# Patient Record
Sex: Female | Born: 1990 | Hispanic: Yes | Marital: Single
Health system: Southern US, Community
[De-identification: ages and names within clinical notes are randomized; demographics above are authoritative.]

---

## 2011-11-28 ENCOUNTER — Emergency Department (HOSPITAL_COMMUNITY): Payer: Medicaid Other

## 2011-11-28 ENCOUNTER — Emergency Department (HOSPITAL_COMMUNITY)
Admission: EM | Admit: 2011-11-28 | Discharge: 2011-11-28 | Disposition: A | Discharge status: Home / Self Care | Payer: Medicaid Other | Attending: Emergency Medicine | Admitting: Emergency Medicine

## 2011-11-28 ENCOUNTER — Encounter (HOSPITAL_COMMUNITY): Payer: Self-pay | Admitting: *Deleted

## 2011-11-28 DIAGNOSIS — S60229A Contusion of unspecified hand, initial encounter: Secondary | ICD-10-CM | POA: Insufficient documentation

## 2011-11-28 DIAGNOSIS — Y92410 Unspecified street and highway as the place of occurrence of the external cause: Secondary | ICD-10-CM | POA: Insufficient documentation

## 2011-11-28 DIAGNOSIS — M542 Cervicalgia: Secondary | ICD-10-CM | POA: Insufficient documentation

## 2011-11-28 DIAGNOSIS — S161XXA Strain of muscle, fascia and tendon at neck level, initial encounter: Secondary | ICD-10-CM

## 2011-11-28 DIAGNOSIS — S139XXA Sprain of joints and ligaments of unspecified parts of neck, initial encounter: Secondary | ICD-10-CM | POA: Insufficient documentation

## 2011-11-28 LAB — CBC
MCV: 87.4 fL (ref 78.0–100.0)
Platelets: 249 10*3/uL (ref 150–400)
RBC: 4.83 MIL/uL (ref 3.87–5.11)
WBC: 12.8 10*3/uL — ABNORMAL HIGH (ref 4.0–10.5)

## 2011-11-28 LAB — COMPREHENSIVE METABOLIC PANEL
ALT: 16 U/L (ref 0–35)
Alkaline Phosphatase: 103 U/L (ref 39–117)
CO2: 24 mEq/L (ref 19–32)
Calcium: 9.2 mg/dL (ref 8.4–10.5)
Chloride: 101 mEq/L (ref 96–112)
GFR calc Af Amer: >90 mL/min (ref >90)
GFR calc non Af Amer: >90 mL/min (ref >90)
Glucose, Bld: 97 mg/dL (ref 70–99)
Potassium: 3.4 mEq/L — ABNORMAL LOW (ref 3.5–5.1)
Sodium: 137 mEq/L (ref 135–145)
Total Bilirubin: 0.2 mg/dL — ABNORMAL LOW (ref 0.3–1.2)

## 2011-11-28 LAB — DIFFERENTIAL
Eosinophils Relative: 1 % (ref 0–5)
Lymphocytes Relative: 20 % (ref 12–46)
Lymphs Abs: 2.5 10*3/uL (ref 0.7–4.0)

## 2011-11-28 MED ORDER — IBUPROFEN 600 MG PO TABS: 600.0000 mg | ORAL_TABLET | Freq: Four times a day (QID) | ORAL | Status: AC | PRN

## 2011-11-28 MED ORDER — METHOCARBAMOL 500 MG PO TABS: 500.0000 mg | ORAL_TABLET | Freq: Two times a day (BID) | ORAL | Status: AC

## 2011-11-28 MED ORDER — SODIUM CHLORIDE 0.9 % IV BOLUS (SEPSIS)
1000.0000 mL | Freq: Once | INTRAVENOUS | Status: AC
  Administered 2011-11-28: 1000 mL via INTRAVENOUS

## 2011-11-28 MED ORDER — TRAMADOL HCL 50 MG PO TABS: 50.0000 mg | ORAL_TABLET | Freq: Four times a day (QID) | ORAL | Status: AC | PRN
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
## 2011-11-28 NOTE — ED Notes (Signed)
Patient transported to CT 
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
## 2011-11-28 NOTE — ED Notes (Signed)
Pt AO X 4.  Minimal swelling to L hand.  No other abnormalities.  Pt able to move all limbs and digits.  PERRL.

## 2011-11-28 NOTE — ED Notes (Signed)
Patient ambulated well to the bathroom and back.
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
## 2011-11-29 MED ORDER — OXYCODONE-ACETAMINOPHEN 5-325 MG PO TABS
ORAL_TABLET | ORAL | Status: AC
  Filled 2011-11-29: qty 1

## 2011-11-29 MED ORDER — HYDROMORPHONE HCL PF 1 MG/ML IJ SOLN
INTRAMUSCULAR | Status: AC
  Filled 2011-11-29: qty 1
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
## 2020-12-07 ENCOUNTER — Emergency Department
Admission: EM | Admit: 2020-12-07 | Discharge: 2020-12-07 | Disposition: A | Discharge status: Home / Self Care | Payer: Medicaid Other | Attending: Student in an Organized Health Care Education/Training Program | Admitting: Student in an Organized Health Care Education/Training Program

## 2020-12-07 ENCOUNTER — Encounter: Payer: Self-pay | Admitting: Emergency Medicine

## 2020-12-07 ENCOUNTER — Other Ambulatory Visit: Payer: Self-pay

## 2020-12-07 ENCOUNTER — Emergency Department: Payer: Medicaid Other

## 2020-12-07 DIAGNOSIS — M25511 Pain in right shoulder: Secondary | ICD-10-CM | POA: Insufficient documentation

## 2020-12-07 DIAGNOSIS — S60221A Contusion of right hand, initial encounter: Secondary | ICD-10-CM | POA: Insufficient documentation

## 2020-12-07 DIAGNOSIS — S5001XA Contusion of right elbow, initial encounter: Secondary | ICD-10-CM | POA: Insufficient documentation

## 2020-12-07 DIAGNOSIS — T07XXXA Unspecified multiple injuries, initial encounter: Secondary | ICD-10-CM
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
## 2020-12-07 NOTE — Discharge Instructions (Addendum)
Follow-up with your primary care provider or urgent care if any continued problems.  You can expect to be sore in the bruised areas for 5 to 7 days.  You may take Tylenol or ibuprofen as needed for pain.  You may also use ice on these areas for comfort.
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
## 2022-05-10 IMAGING — CR DG ELBOW COMPLETE 3+V*R*
1 series · 4 of 4 positions shown · non-contrast
Comparison: None.

CLINICAL DATA: Injury.  Pain

EXAM:
RIGHT ELBOW - COMPLETE 3+ VIEW

[Series 1: dg elbow complete right (3+view) · 0.14mm/px · 4 of 4 slices shown]
[im 1/4]
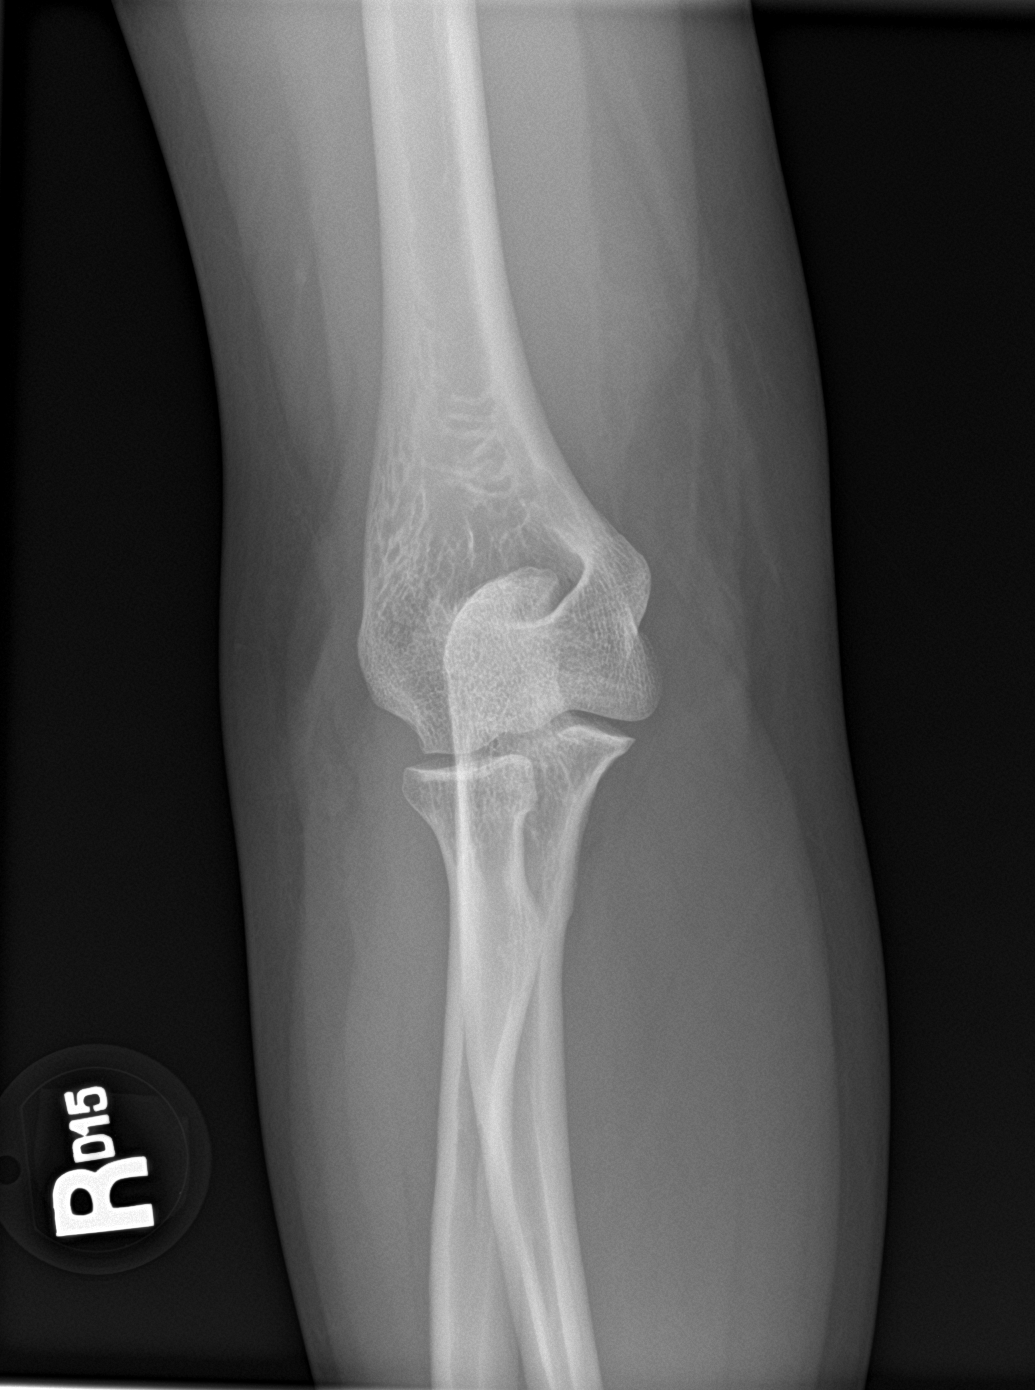
[im 2/4]
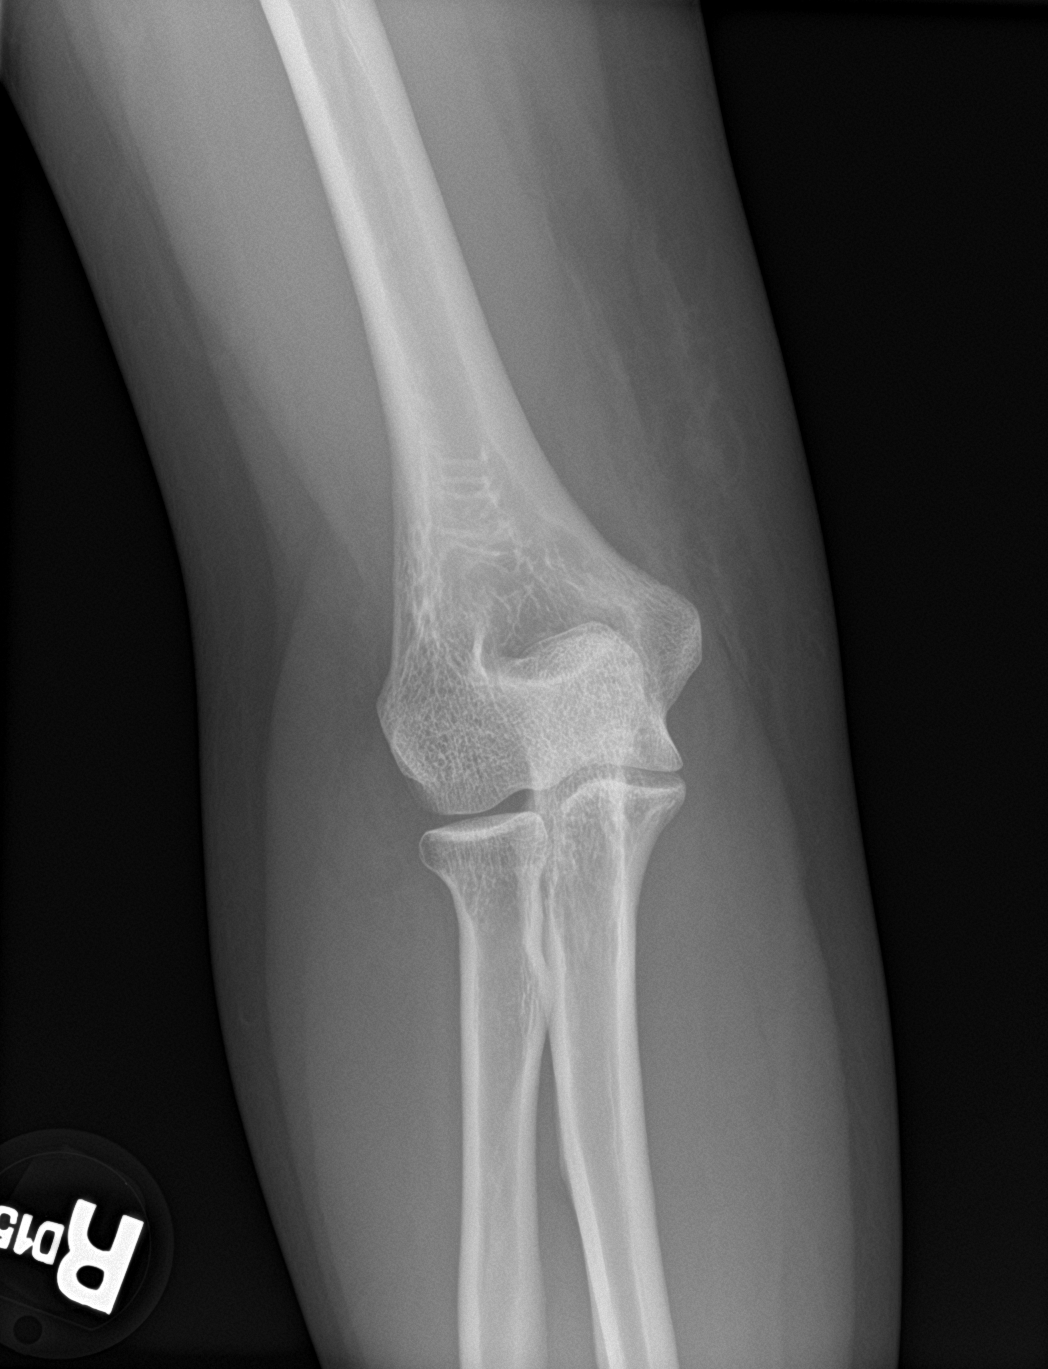
[im 3/4]
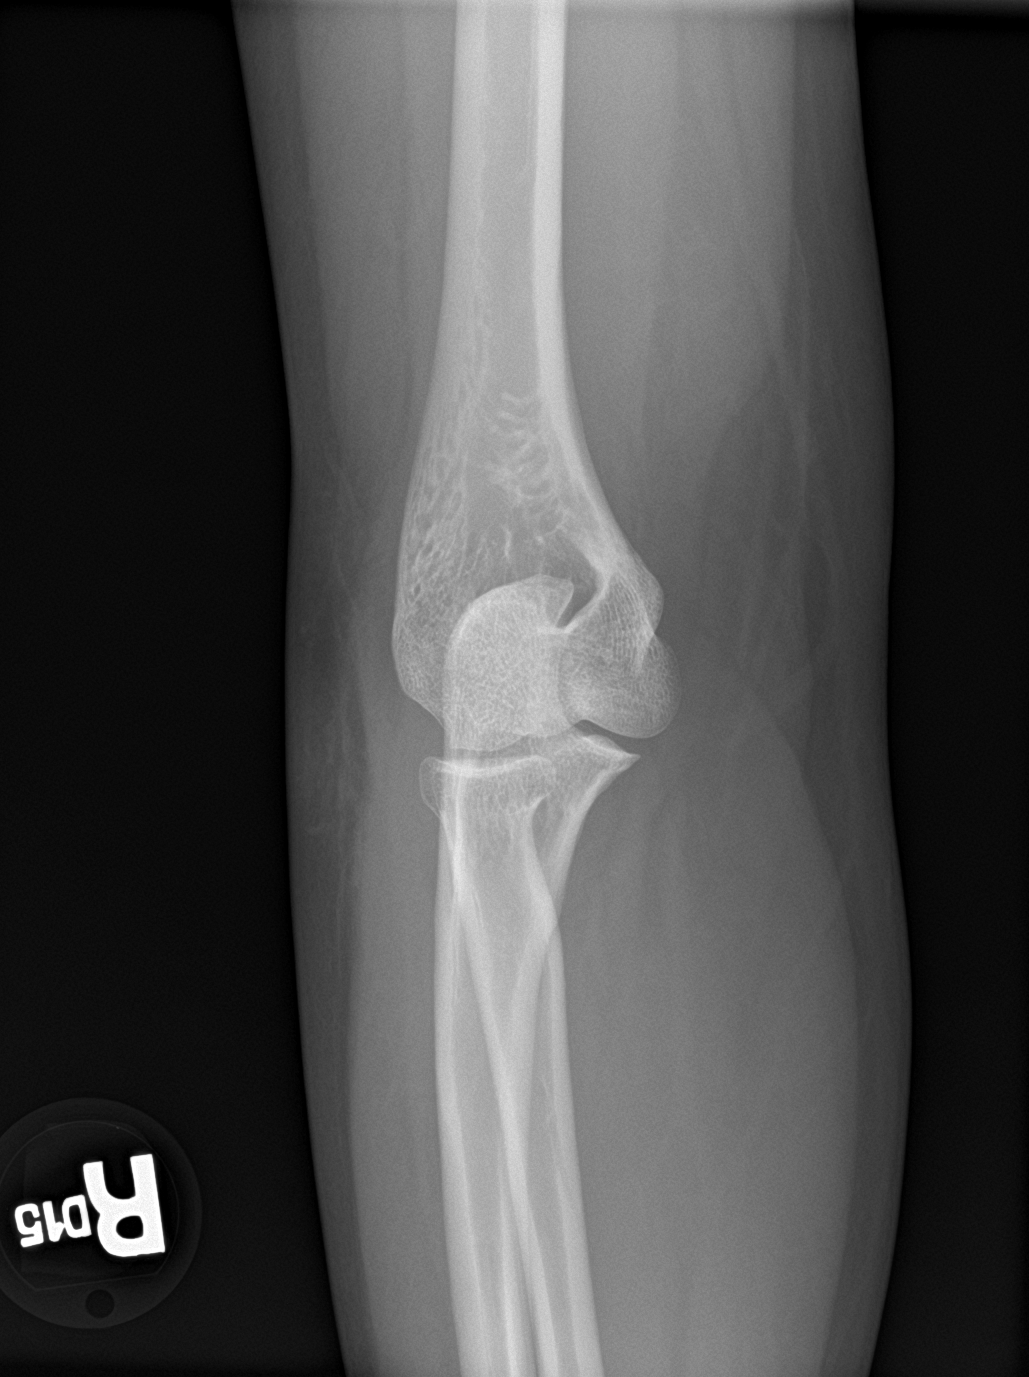
[im 4/4]
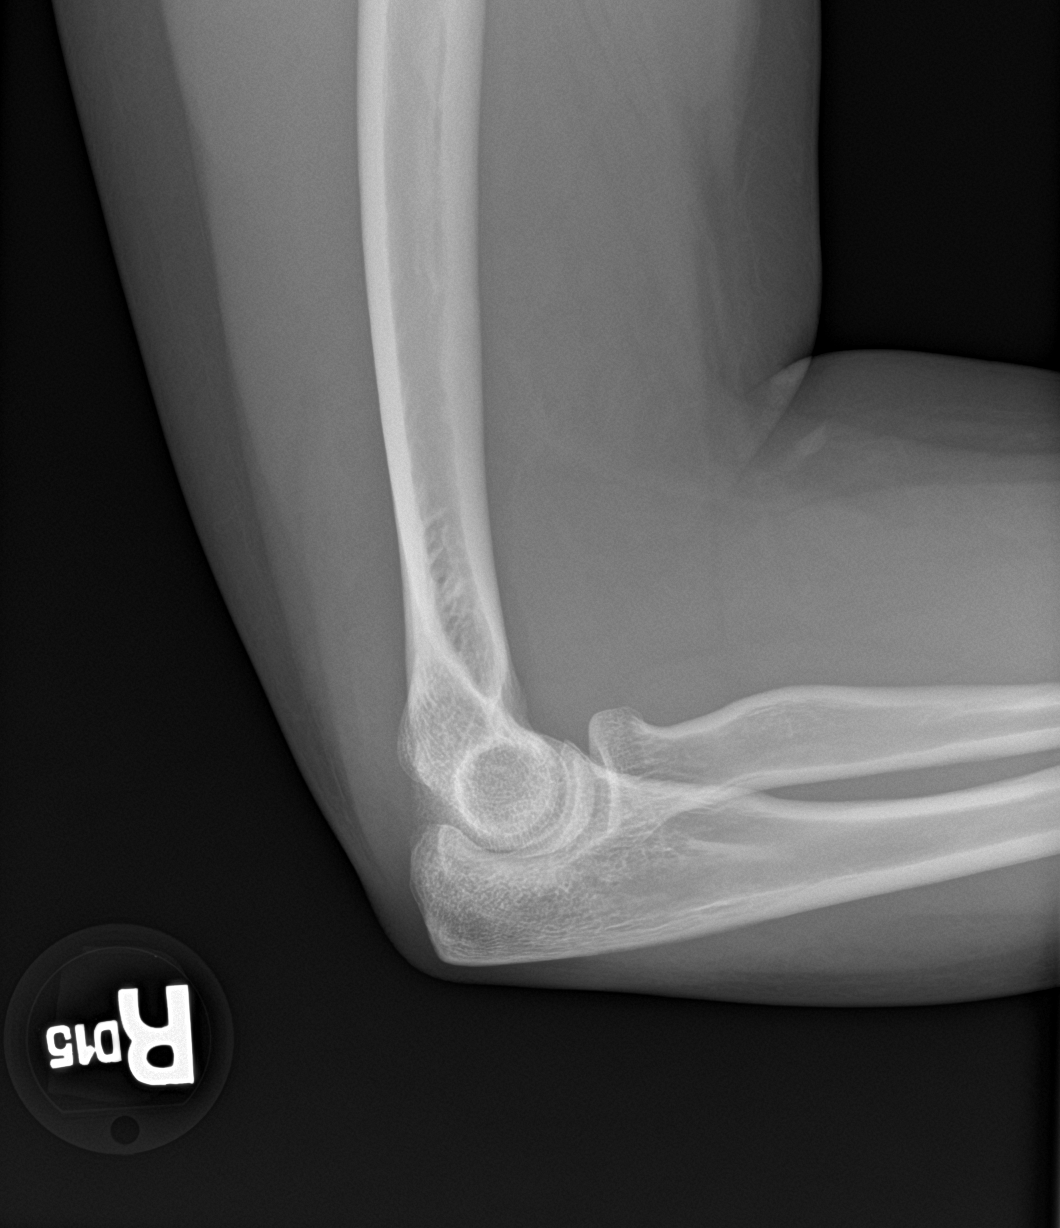

[4 of 4 positions shown; findings below may reference images not displayed]

FINDINGS: There is no evidence of fracture, dislocation, or joint effusion.
There is no evidence of arthropathy or other focal bone abnormality.
Soft tissues are unremarkable.
IMPRESSION: Negative.
